# Patient Record
Sex: Male | Born: 1975 | Hispanic: No | Marital: Married | Smoking: Never smoker
Health system: Southern US, Community
[De-identification: ages and names within clinical notes are randomized; demographics above are authoritative.]

---

## 2010-04-02 ENCOUNTER — Emergency Department (HOSPITAL_COMMUNITY): Admission: EM | Admit: 2010-04-02 | Discharge: 2010-04-02 | Payer: Self-pay | Admitting: Emergency Medicine

## 2012-01-24 IMAGING — CT CT HEAD W/O CM
1 series · 16 of 30 positions shown, 20 images · non-contrast
Comparison: None.

CLINICAL DATA: Trauma to the vertex of the skull

CT HEAD WITHOUT CONTRAST
TECHNIQUE: Contiguous axial images were obtained from the base of
the skull through the vertex without contrast.

[Series 2: head_seq 4.5 h37s st · axial · 0.43mm/px · z∈[-131,-5]mm · 16 of 32 slices shown, 20 images]
[im 2/32  brain]
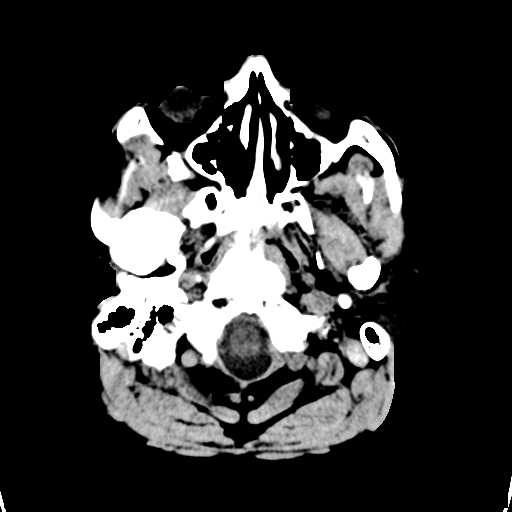
[im 2/32  bone]
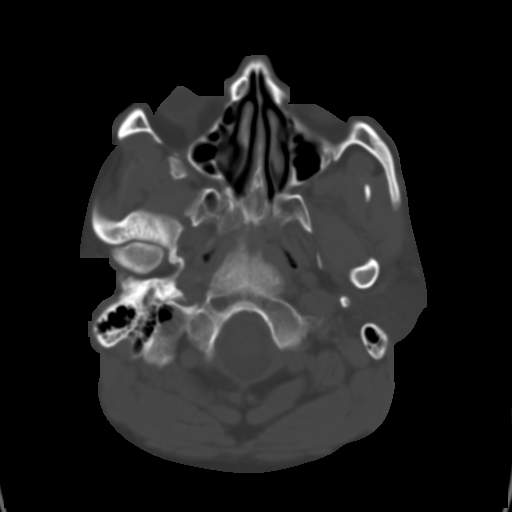
[im 4/32  brain]
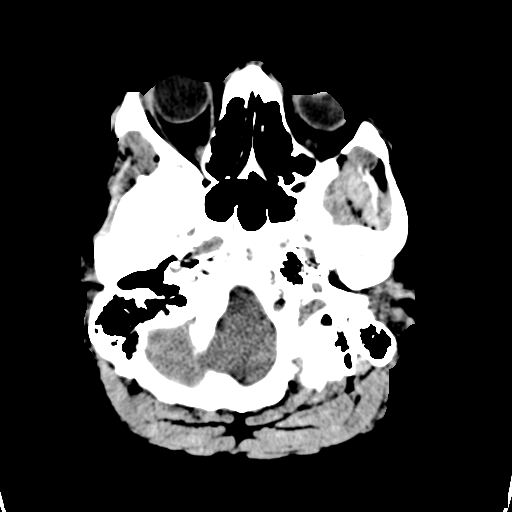
[im 6/32  brain]
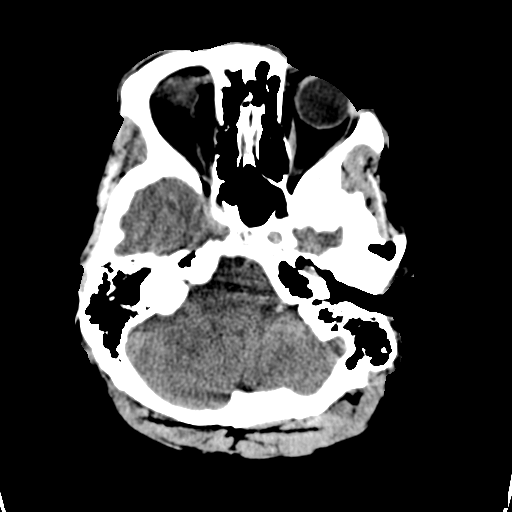
[im 8/32  brain]
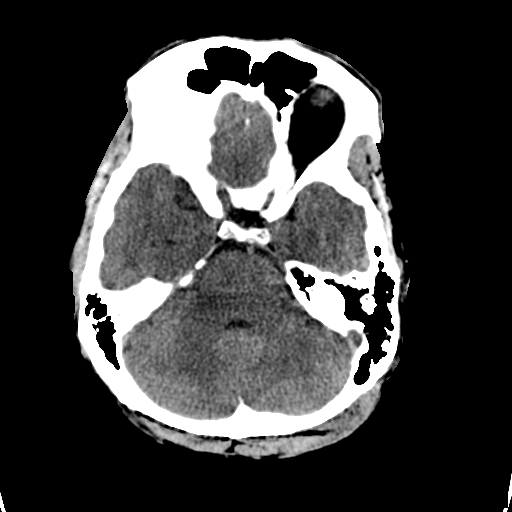
[im 9/32  brain]
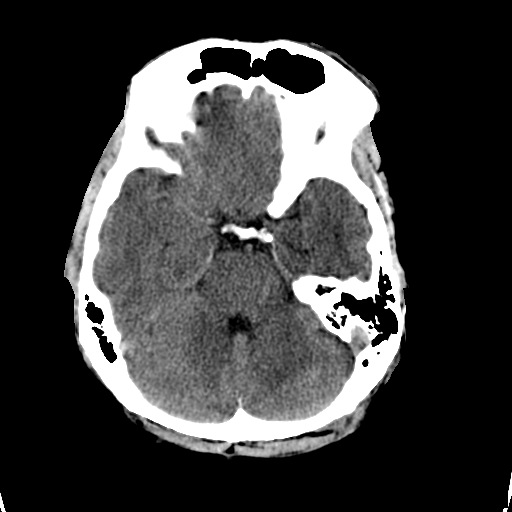
[im 9/32  bone]
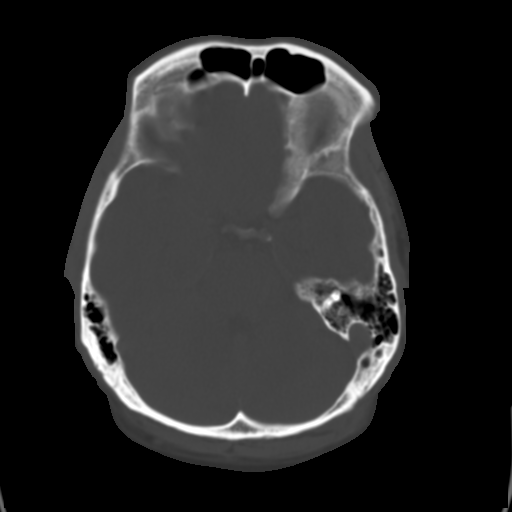
[im 11/32  brain]
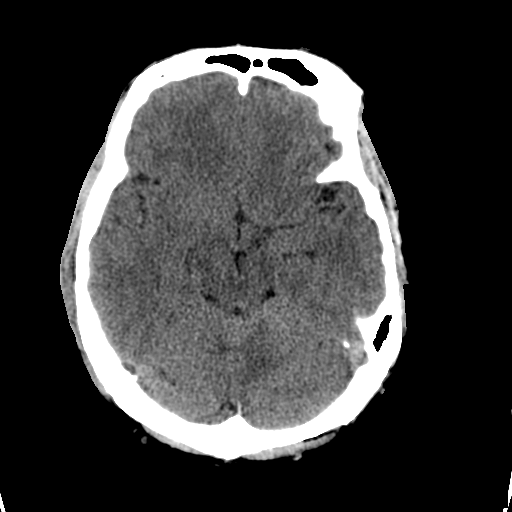
[im 13/32  brain]
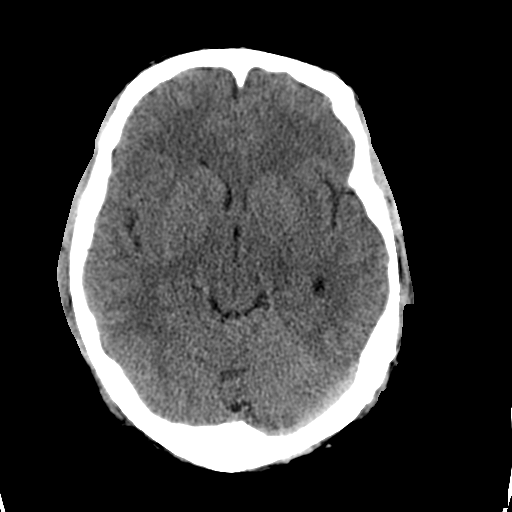
[im 15/32  brain]
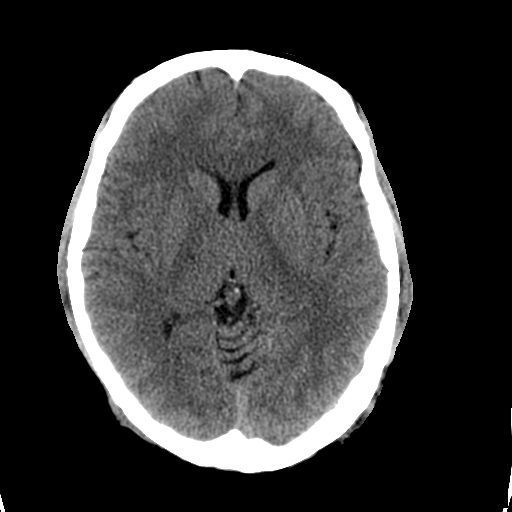
[im 17/32  brain]
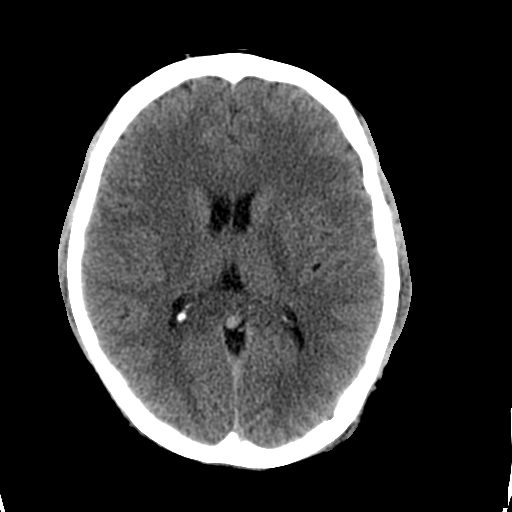
[im 17/32  bone]
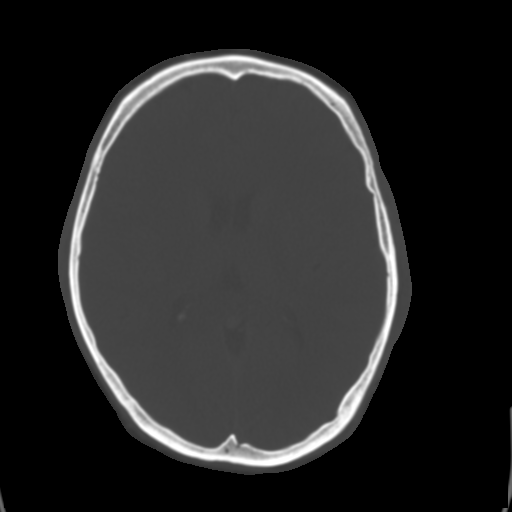
[im 19/32  brain]
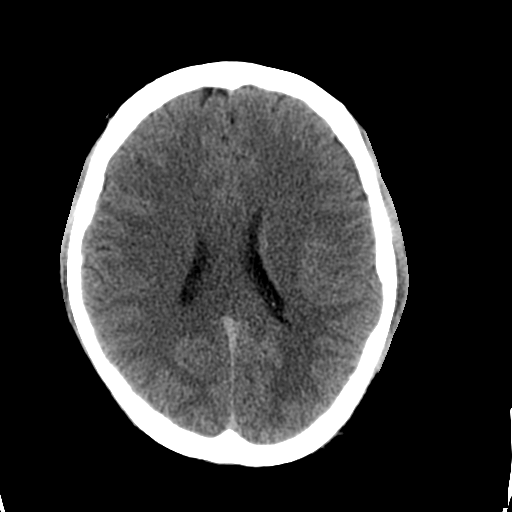
[im 21/32  brain]
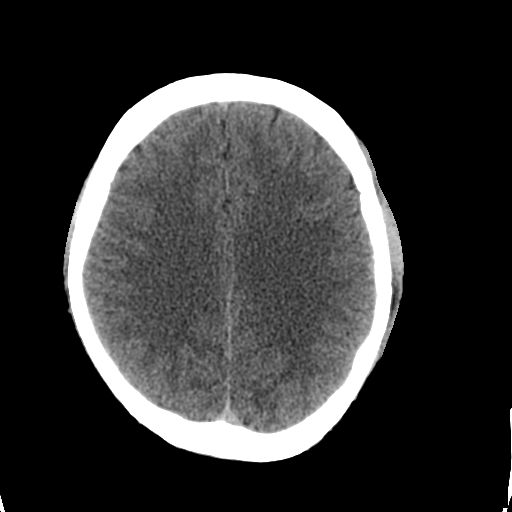
[im 23/32  brain]
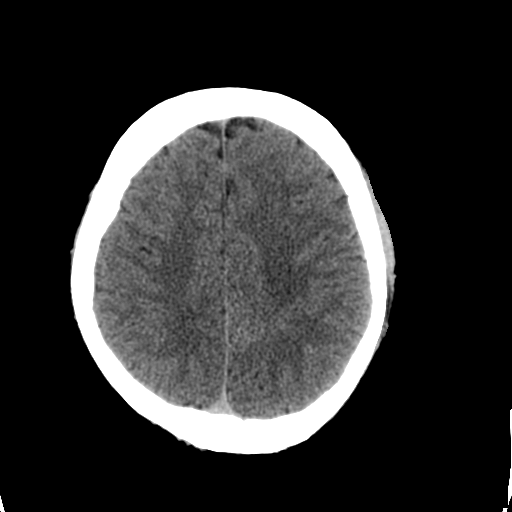
[im 24/32  brain]
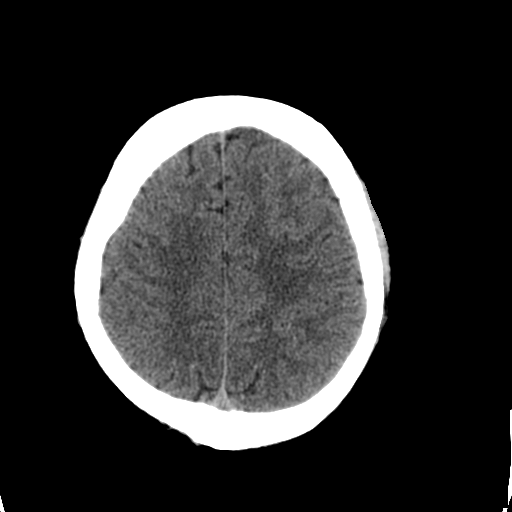
[im 24/32  bone]
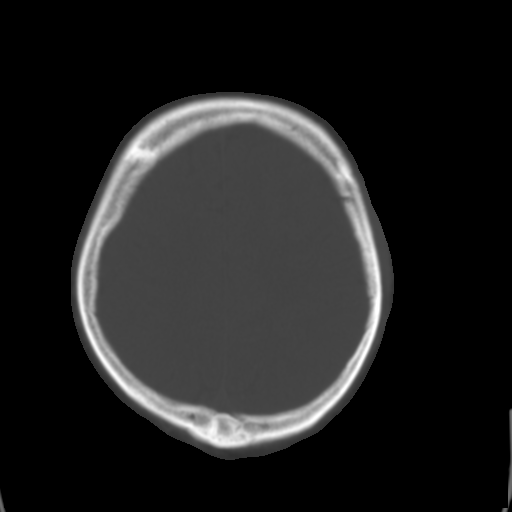
[im 26/32  brain]
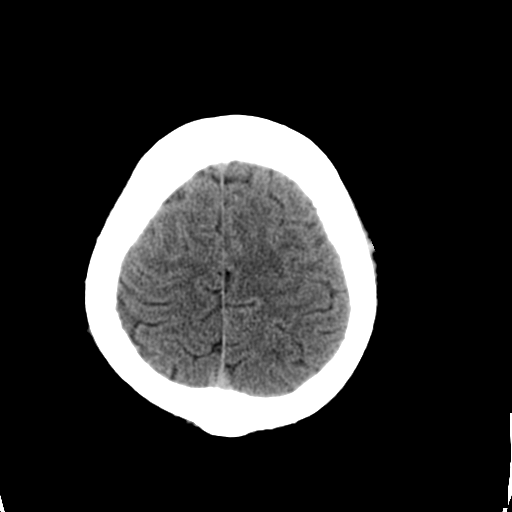
[im 28/32  brain]
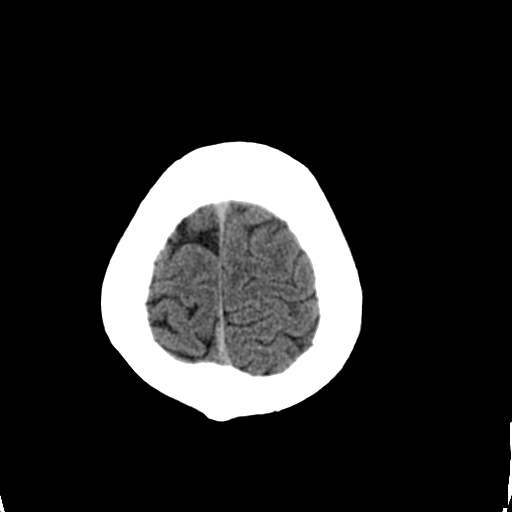
[im 30/32  brain]
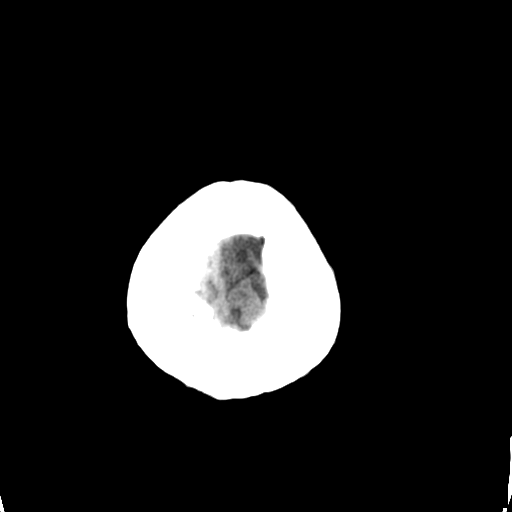

[16 of 30 positions shown; findings below may reference images not displayed]

FINDINGS: No acute hemorrhage, acute infarction, or mass lesion is
identified.  No midline shift.  No ventriculomegaly.  No skull
fracture.
IMPRESSION: No acute abnormality.

## 2015-10-29 ENCOUNTER — Encounter (HOSPITAL_COMMUNITY): Payer: Self-pay | Admitting: Emergency Medicine

## 2015-10-29 ENCOUNTER — Emergency Department (HOSPITAL_COMMUNITY): Payer: Self-pay

## 2015-10-29 ENCOUNTER — Emergency Department (HOSPITAL_COMMUNITY)
Admission: EM | Admit: 2015-10-29 | Discharge: 2015-10-29 | Disposition: A | Discharge status: Home / Self Care | Payer: Self-pay | Attending: Emergency Medicine | Admitting: Emergency Medicine

## 2015-10-29 DIAGNOSIS — Y9289 Other specified places as the place of occurrence of the external cause: Secondary | ICD-10-CM | POA: Insufficient documentation

## 2015-10-29 DIAGNOSIS — Y998 Other external cause status: Secondary | ICD-10-CM | POA: Insufficient documentation

## 2015-10-29 DIAGNOSIS — S61219A Laceration without foreign body of unspecified finger without damage to nail, initial encounter: Secondary | ICD-10-CM

## 2015-10-29 DIAGNOSIS — Z23 Encounter for immunization: Secondary | ICD-10-CM | POA: Insufficient documentation

## 2015-10-29 DIAGNOSIS — Y9389 Activity, other specified: Secondary | ICD-10-CM | POA: Insufficient documentation

## 2015-10-29 DIAGNOSIS — S61312A Laceration without foreign body of right middle finger with damage to nail, initial encounter: Secondary | ICD-10-CM | POA: Insufficient documentation

## 2015-10-29 DIAGNOSIS — W231XXA Caught, crushed, jammed, or pinched between stationary objects, initial encounter: Secondary | ICD-10-CM | POA: Insufficient documentation

## 2015-10-29 MED ORDER — TETANUS-DIPHTH-ACELL PERTUSSIS 5-2.5-18.5 LF-MCG/0.5 IM SUSP: 0.5000 mL | Freq: Once | INTRAMUSCULAR | Status: DC

## 2015-10-29 MED ORDER — CEPHALEXIN 500 MG PO CAPS: 500.0000 mg | ORAL_CAPSULE | Freq: Four times a day (QID) | ORAL | Status: AC

## 2015-10-29 MED ORDER — CEPHALEXIN 250 MG PO CAPS
500.0000 mg | ORAL_CAPSULE | Freq: Once | ORAL | Status: AC
  Administered 2015-10-29: 500 mg via ORAL
  Filled 2015-10-29: qty 2

## 2015-10-29 MED ORDER — LIDOCAINE HCL 2 % IJ SOLN
20.0000 mL | Freq: Once | INTRAMUSCULAR | Status: AC
  Administered 2015-10-29: 400 mg via INTRADERMAL
  Filled 2015-10-29: qty 20

## 2015-10-29 MED ORDER — TETANUS-DIPHTH-ACELL PERTUSSIS 5-2.5-18.5 LF-MCG/0.5 IM SUSP
0.5000 mL | Freq: Once | INTRAMUSCULAR | Status: AC
  Administered 2015-10-29: 0.5 mL via INTRAMUSCULAR
  Filled 2015-10-29: qty 0.5

## 2015-10-29 NOTE — ED Notes (Signed)
Patient states finger laceration from working on a car today and slamming his finger into the door.   Laceration to tip of middle finger on the R hand.  Denies other symptoms.

## 2015-10-29 NOTE — Discharge Instructions (Signed)
Please read and follow all provided instructions.  Your diagnoses today include:  1. Finger laceration, initial encounter    Tests performed today include:  X-ray of the affected area that did not show any foreign bodies or broken bones  Vital signs. See below for your results today.   Medications prescribed:   Keflex (cephalexin) - antibiotic  You have been prescribed an antibiotic medicine: take the entire course of medicine even if you are feeling better. Stopping early can cause the antibiotic not to work.  Take any prescribed medications only as directed.   Home care instructions:  Follow any educational materials and wound care instructions contained in this packet.   Keep affected area above the level of your heart when possible to minimize swelling. Wash area gently twice a day with warm soapy water. Do not apply alcohol or hydrogen peroxide. Cover the area if it draining or weeping.   Follow-up instructions: Suture Removal: Return to the Emergency Department or see your primary care care doctor in 7 days for a recheck of your wound and removal of your sutures or staples.    Return instructions:  Return to the Emergency Department if you have:  Fever  Worsening pain  Worsening swelling of the wound  Pus draining from the wound  Redness of the skin that moves away from the wound, especially if it streaks away from the affected area   Any other emergent concerns  Your vital signs today were: BP 136/97 mmHg   Pulse 79   Temp(Src) 98.4 F (36.9 C) (Oral)   Resp 18   SpO2 100% If your blood pressure (BP) was elevated above 135/85 this visit, please have this repeated by your doctor within one month. --------------

## 2015-10-29 NOTE — ED Provider Notes (Signed)
CSN: [id number]     Arrival date & time 10/29/15  1226 History   First MD Initiated Contact with Patient 10/29/15 1245     Chief Complaint  Patient presents with  . Laceration    (Consider location/radiation/quality/duration/timing/severity/associated sxs/prior Treatment) HPI Comments: Patient presents with complaint of right long fingertip laceration sustained in an injury with a car door. Patient's finger was pinched and lacerated. Patient rinsed with water prior to arrival. Incident occurred just prior to arrival. No other injuries noted. Last tetanus unknown. 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 No numbness or tingling noted. Patient complains of pain in the fingertip only. No blood thinners or immune compromise.  Patient is a 40 y.o. male presenting with skin laceration. The history is provided by the patient and a friend.  Laceration   History reviewed. No pertinent past medical history. History reviewed. No pertinent past surgical history. No family history on file. Social History  Substance Use Topics  . Smoking status: Never Smoker   . Smokeless tobacco: None  . Alcohol Use: No    Review of Systems  Constitutional: Negative for fever.  Gastrointestinal: Negative for nausea and vomiting.  Musculoskeletal: Positive for arthralgias. Negative for joint swelling, gait problem and neck pain.  Skin: Positive for wound.  Neurological: Negative for weakness and numbness.    Allergies  Review of patient's allergies indicates no known allergies.  Home Medications   Prior to Admission medications   Not on File   BP 136/101 mmHg  Pulse 74  Temp(Src) 98.4 F (36.9 C) (Oral)  Resp 18  SpO2 100%   Physical Exam  Constitutional: He appears well-developed and well-nourished.  HENT:  Head: Normocephalic and atraumatic.  Eyes: Conjunctivae are normal.  Neck: Normal range of motion. Neck supple.  Cardiovascular: Normal pulses.   Musculoskeletal: He exhibits  tenderness. He exhibits no edema.  There is a clean appearing, mildly oozing 1.5 cm laceration of the right long finger extending from the radial aspect of the finger to just beneath the most distal aspect of the nailbed under the nail plate. The distal tip itelf is slightly mobile. Base of wound explored after anesthesia and no foreign body seen or palpated. No tendon or nerve involvement visualized.   Neurological: He is alert. No sensory deficit.  Motor, sensation, and vascular distal to the injury is fully intact.   Skin: Skin is warm and dry.  Psychiatric: He has a normal mood and affect.  Nursing note and vitals reviewed.   ED Course  Procedures (including critical care time) Imaging Review Dg Finger Middle Right  10/29/2015  CLINICAL DATA:  Right middle finger laceration after being slammed in car door. Initial encounter. EXAM: RIGHT MIDDLE FINGER 2+V COMPARISON:  None. FINDINGS: There is no evidence of fracture or dislocation. There is no evidence of arthropathy or other focal bone abnormality. Soft tissues are unremarkable. IMPRESSION: Normal right middle finger. Electronically Signed   By: Lupita Raider, M.D.   On: 10/29/2015 13:21   I have personally reviewed and evaluated these images and lab results as part of my medical decision-making.  1:54 PM Patient seen and examined. X-ray ordered. Medications ordered. Finger splint.   Vital signs reviewed and are as follows: BP 136/101 mmHg  Pulse 74  Temp(Src) 98.4 F (36.9 C) (Oral)  Resp 18  SpO2 100%  LACERATION REPAIR Performed by: Carolee Rota Authorized by: Carolee Rota Consent: Verbal consent obtained. Risks and benefits: risks, benefits and alternatives were discussed  Consent given by: patient Patient identity confirmed: provided demographic data Prepped and Draped in normal sterile fashion Wound explored  Laceration Location: R fingertip  Laceration Length: 1.5cm  No Foreign Bodies seen or  palpated  Anesthesia: local infiltration  Local anesthetic: lidocaine 2% without epinephrine  Anesthetic total: 1.5 ml  Irrigation method: syringe, skin scrub Amount of cleaning: standard  Skin closure: 5-0 Ethilon  Number of sutures: 4  Technique: simple interrupted  Patient tolerance: Patient tolerated the procedure well with no immediate complications.    The radial aspect of the laceration was sutured and tip was stable after suturing. I discussed with patient that to repair the small extension of the laceration under the nail plate, I would have to perform a digital block, remove the nail, repair the laceration and replace the nail. I do not feel that the benefit to wound healing of repairing this small part of the laceration is worth this effort especially since the tip is now very stable. I did discuss with the patient that he is going to have to take strict precautions to allow this area to heal. I have recommended splitting of the finger in the use of the hand or finger until sutures removed in one week. Will give note for work. Also start on Keflex. Tetanus updated.   Pt urged to return with worsening pain, worsening swelling, expanding area of redness or streaking up extremity, fever, or any other concerns. Urged to take complete course of antibiotics as prescribed. Pt verbalizes understanding and agrees with plan.   MDM   Final diagnoses:  Finger laceration, initial encounter   Discussion as above. I urged patient not to return to activities with his hand/finger until wound is fully healed. He is to keep it splinted except for cleaning. Antibiotics, follow-up, and return instructions as above.     Renne CriglerJoshua Trystin Hargrove, PA-C 10/29/15 1419  Linwood DibblesJon Knapp, MD 10/30/15 1012

## 2017-08-21 IMAGING — DX DG FINGER MIDDLE 2+V*R*
3 series · 3 of 3 positions shown · non-contrast
Comparison: None.

CLINICAL DATA: Right middle finger laceration after being slammed
in car door. Initial encounter.

EXAM:
RIGHT MIDDLE FINGER 2+V

[finger ap]
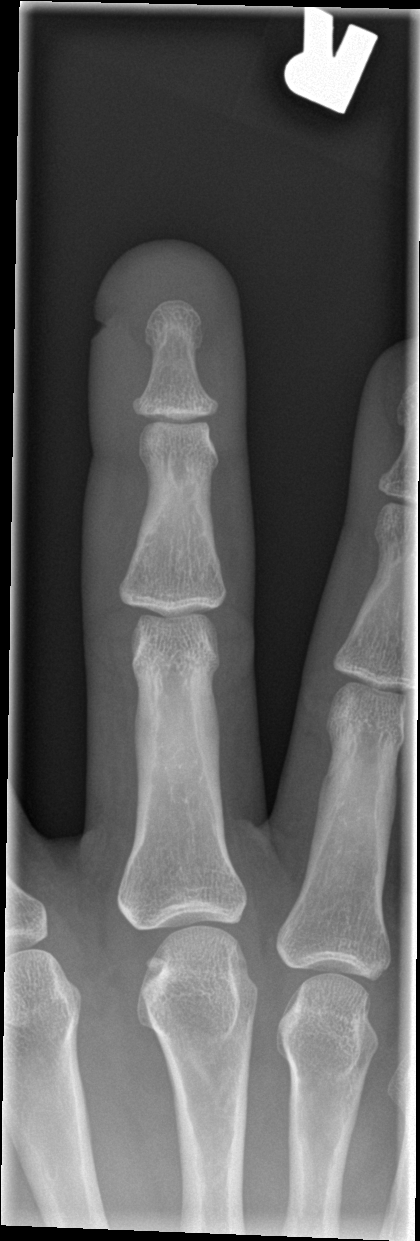

[finger obl]
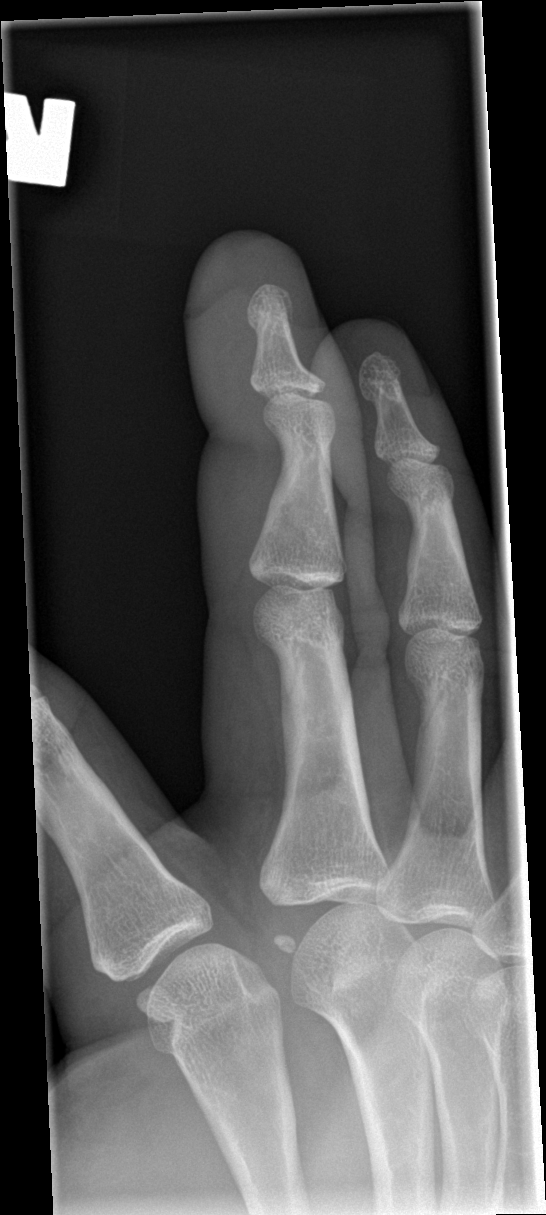

[finger lat]
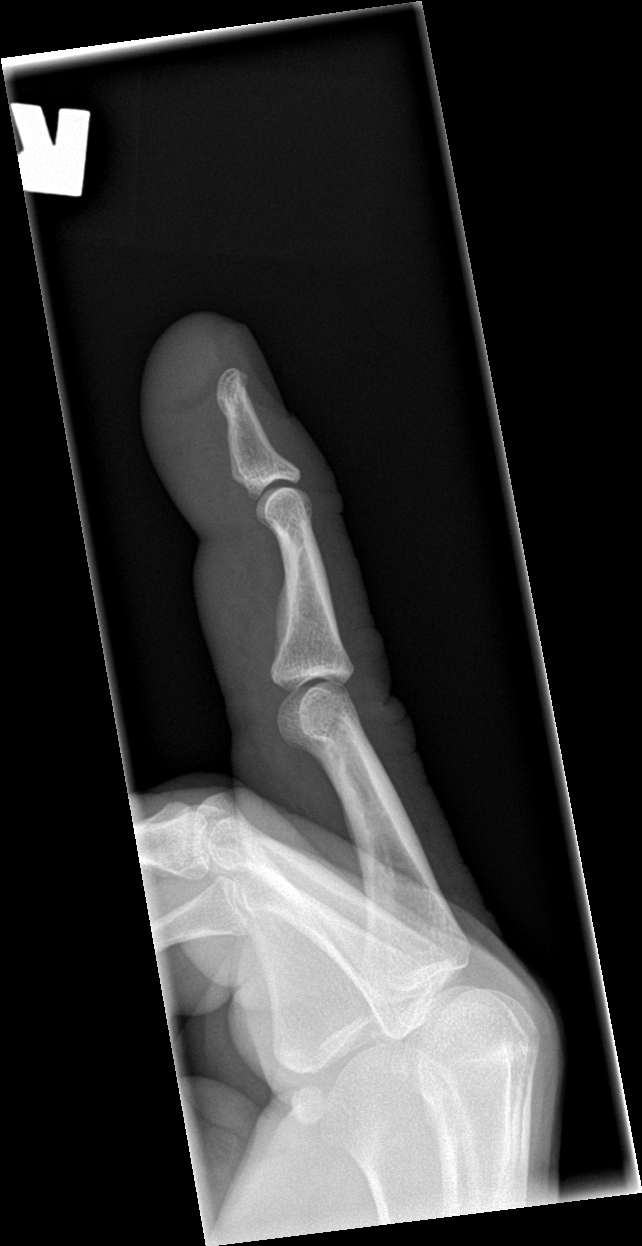

[3 of 3 positions shown; findings below may reference images not displayed]

FINDINGS: There is no evidence of fracture or dislocation. There is no
evidence of arthropathy or other focal bone abnormality. Soft
tissues are unremarkable.
IMPRESSION: Normal right middle finger.
